# Patient Record
Sex: Male | Born: 1970 | Race: White | Hispanic: No | Marital: Married | Smoking: Never smoker
Health system: Southern US, Community
[De-identification: ages and names within clinical notes are randomized; demographics above are authoritative.]

---

## 2003-11-09 ENCOUNTER — Emergency Department (HOSPITAL_COMMUNITY): Admission: EM | Admit: 2003-11-09 | Discharge: 2003-11-09 | Payer: Self-pay | Admitting: Emergency Medicine

## 2007-11-05 ENCOUNTER — Emergency Department (HOSPITAL_COMMUNITY): Admission: EM | Admit: 2007-11-05 | Discharge: 2007-11-05 | Payer: Self-pay | Admitting: Emergency Medicine

## 2011-06-26 LAB — POCT I-STAT CREATININE
Creatinine, Ser: 1.1
Operator id: 270651

## 2011-06-26 LAB — HEPATIC FUNCTION PANEL
ALT: 40
AST: 39 — ABNORMAL HIGH
Total Protein: 6.6

## 2011-06-26 LAB — I-STAT 8, (EC8 V) (CONVERTED LAB)
Acid-Base Excess: 2
Chloride: 103
HCT: 45
Operator id: 270651
Potassium: 3.4 — ABNORMAL LOW
TCO2: 27
pCO2, Ven: 36.4 — ABNORMAL LOW

## 2011-06-26 LAB — CBC
Platelets: 187
RDW: 12.8

## 2011-06-26 LAB — DIFFERENTIAL
Basophils Absolute: 0
Lymphocytes Relative: 27
Neutro Abs: 2.9
Neutrophils Relative %: 60

## 2012-05-16 ENCOUNTER — Emergency Department (HOSPITAL_COMMUNITY): Payer: 59

## 2012-05-16 ENCOUNTER — Encounter (HOSPITAL_COMMUNITY): Payer: Self-pay | Admitting: *Deleted

## 2012-05-16 ENCOUNTER — Emergency Department (HOSPITAL_COMMUNITY)
Admission: EM | Admit: 2012-05-16 | Discharge: 2012-05-16 | Disposition: A | Discharge status: Home / Self Care | Payer: 59 | Attending: Emergency Medicine | Admitting: Emergency Medicine

## 2012-05-16 DIAGNOSIS — R0781 Pleurodynia: Secondary | ICD-10-CM

## 2012-05-16 DIAGNOSIS — T148XXA Other injury of unspecified body region, initial encounter: Secondary | ICD-10-CM | POA: Insufficient documentation

## 2012-05-16 DIAGNOSIS — S51809A Unspecified open wound of unspecified forearm, initial encounter: Secondary | ICD-10-CM | POA: Insufficient documentation

## 2012-05-16 DIAGNOSIS — T07XXXA Unspecified multiple injuries, initial encounter: Secondary | ICD-10-CM

## 2012-05-16 DIAGNOSIS — R079 Chest pain, unspecified: Secondary | ICD-10-CM | POA: Insufficient documentation

## 2012-05-16 MED ORDER — HYDROCODONE-ACETAMINOPHEN 5-325 MG PO TABS: 1.0000 | ORAL_TABLET | ORAL | Status: AC | PRN
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
## 2012-05-16 NOTE — ED Notes (Signed)
Pt c/o left rib pain, road rash noted to chest wall, describes pain as sharp with deep inspiration, area palpated, no crepitus noted

## 2013-01-28 ENCOUNTER — Ambulatory Visit (INDEPENDENT_AMBULATORY_CARE_PROVIDER_SITE_OTHER): Payer: 59 | Admitting: Family Medicine

## 2013-01-28 ENCOUNTER — Ambulatory Visit: Payer: 59

## 2013-01-28 VITALS — BP 113/70 | HR 109 | Temp 99.2°F | Resp 16 | Ht 69.5 in | Wt 172.6 lb

## 2013-01-28 DIAGNOSIS — R05 Cough: Secondary | ICD-10-CM

## 2013-01-28 DIAGNOSIS — K625 Hemorrhage of anus and rectum: Secondary | ICD-10-CM

## 2013-01-28 DIAGNOSIS — J40 Bronchitis, not specified as acute or chronic: Secondary | ICD-10-CM

## 2013-01-28 DIAGNOSIS — R059 Cough, unspecified: Secondary | ICD-10-CM

## 2013-01-28 DIAGNOSIS — K921 Melena: Secondary | ICD-10-CM

## 2013-01-28 LAB — POCT CBC
Granulocyte percent: 53.8 %G (ref 37–80)
Hemoglobin: 11.5 g/dL — AB (ref 14.1–18.1)
MCH, POC: 27.6 pg (ref 27–31.2)
MPV: 8.4 fL (ref 0–99.8)
POC MID %: 10.5 %M (ref 0–12)
RBC: 4.16 M/uL — AB (ref 4.69–6.13)
WBC: 5.4 10*3/uL (ref 4.6–10.2)

## 2013-01-28 LAB — POCT INFLUENZA A/B
Influenza A, POC: NEGATIVE
Influenza B, POC: NEGATIVE

## 2013-01-28 MED ORDER — AZITHROMYCIN 500 MG PO TABS: 500.0000 mg | ORAL_TABLET | Freq: Every day | ORAL | Status: DC

## 2013-01-28 MED ORDER — GUAIFENESIN-CODEINE 100-10 MG/5ML PO SOLN: ORAL | Status: DC

## 2013-01-28 MED ORDER — ALBUTEROL SULFATE HFA 108 (90 BASE) MCG/ACT IN AERS: 2.0000 | INHALATION_SPRAY | RESPIRATORY_TRACT | Status: DC | PRN

## 2013-01-28 NOTE — Progress Notes (Signed)
Xray read and patient discussed with Eula Listen, PA-C. Agree with assessment and plan of care per his note. XR report: Findings: Heart and mediastinal contours are within normal limits.  No focal opacities or effusions. No acute bony abnormality. Slight  peribronchial thickening.  IMPRESSION:  Bronchitic changes.

## 2013-01-28 NOTE — Progress Notes (Signed)
Patient ID: Adam Salinas MRN: [id number], DOB: [birth year], 42 y.o. Date of Encounter: 01/28/2013, 2:14 PM  Primary Physician: No primary provider on file.  Chief Complaint: URI symptoms x 5-6 days  HPI: 42 y.o. male with history below presents with 5-6 day history of worsening productive cough, nasal congestion, post nasal drip, sinus pressure, and a 1 day history of fever, chills, and myalgias. Uncertain of T max. Cough is productive of yellow sputum and worse at night time. No shortness of breath or wheezing. Not sleeping well at nighttime. Sore from coughing. Notes some chest tightness only when coughing. No chest pain. No sick contacts, recent antibiotics, or recent travels. No sedentary periods, history of cancer, trauma, or tobacco use.    Patient states about 3-4 years ago he was evaluated by a GI physician who's name he cannot recall today for BRBPR associated with diarrhea. At that time he had an anoscopy and was diagnosed with hemorrhoids. His symptoms did improve for a while. However he states that recently he has been feeling "yucky," again and has been noticing BRBPR and some dark red blood as well. No pain associated with bowel movements.   No past medical history on file.   Home Meds: Prior to Admission medications   Not on File    Allergies: No Known Allergies  History   Social History  . Marital Status: Married    Spouse Name: N/A    Number of Children: N/A  . Years of Education: N/A   Occupational History  . Not on file.   Social History Main Topics  . Smoking status: Never Smoker   . Smokeless tobacco: Not on file  . Alcohol Use: Yes     Comment: occasionally  . Drug Use: No  . Sexually Active: Yes    Birth Control/ Protection: None   Other Topics Concern  . Not on file   Social History Narrative  . No narrative on file     Review of Systems: Constitutional: positive for fevers, chills, myalgias, and fatigue. negative for weight changes, and  night sweats.  HEENT: see above Cardiovascular: negative for chest pain and palpitations  Respiratory: see above Abdominal: positive for diarrhea, BRBPR, and melena. negative for abdominal pain, nausea, and vomiting Dermatologic:negative for rash Neurologic: positive for headache. negative for dizziness      Physical Exam: Blood pressure 113/70, pulse 109, temperature 99.2 F (37.3 C), temperature source Oral, resp. rate 16, height 5' 9.5" (1.765 m), weight 172 lb 9.6 oz (78.291 kg), SpO2 96.00%., Body mass index is 25.13 kg/(m^2). General: Well developed, well nourished, in no acute distress. Not toxic appearing.  Head: Normocephalic, atraumatic, eyes without discharge, sclera non-icteric, nares are without discharge. Bilateral auditory canals clear, TM's are without perforation, pearly grey and translucent with reflective cone of light bilaterally. Oral cavity moist, posterior pharynx with post nasal drip. No exudate, erythema, or peritonsillar abscess. Uvula midline.   Neck: Supple. No thyromegaly. Full ROM. No lymphadenopathy. Lungs: Coarse breath sounds bilaterally without wheezes, rales, or rhonchi. Breathing is unlabored. Heart: RRR with S1 S2. No murmurs, rubs, or gallops appreciated. Msk:  Strength and tone normal for age. Extremities/Skin: Warm and dry. No clubbing or cyanosis. No edema. No rashes or suspicious lesions. Neuro: Alert and oriented X 3. Moves all extremities spontaneously. Gait is normal. CNII-XII grossly in tact. Psych:  Responds to questions appropriately with a normal affect.   Labs: Results for orders placed in visit on 01/28/13  POCT CBC  Result Value Range   WBC 5.4  4.6 - 10.2 K/uL   Lymph, poc 1.9  0.6 - 3.4   POC LYMPH PERCENT 35.7  10 - 50 %L   MID (cbc) 0.6  0 - 0.9   POC MID % 10.5  0 - 12 %M   POC Granulocyte 2.9  2 - 6.9   Granulocyte percent 53.8  37 - 80 %G   RBC 4.16 (*) 4.69 - 6.13 M/uL   Hemoglobin 11.5 (*) 14.1 - 18.1 g/dL   HCT, POC  16.1 (*) 09.6 - 53.7 %   MCV 90.2  80 - 97 fL   MCH, POC 27.6  27 - 31.2 pg   MCHC 30.7 (*) 31.8 - 35.4 g/dL   RDW, POC 04.5     Platelet Count, POC 234  142 - 424 K/uL   MPV 8.4  0 - 99.8 fL  POCT INFLUENZA A/B      Result Value Range   Influenza A, POC Negative     Influenza B, POC Negative      CXR: [facility] reading (PRIMARY) by  Dr. Neva Seat. Mildly increased perihilar markings. Otherwise negative.   ASSESSMENT AND PLAN:  42 y.o. male with bronchitis, anemia, and history of BRBPR/melena 1) Bronchitis  -Azithromycin 500 mg 1 po daily #5 no RF -Guaifenesin-Codeine 1 tsp po q 4-6 hours prn cough #120 mL no RF -Proventil 2 puffs inhaled q 4-6 hours prn #1 no RF -Mucinex -Rest/Fluids -RTC prn  2) Anemia, history of BRBPR/melena -Previously evaluated by unknown GI -Will refer for evaluation and treatment -Gave blood 3 weeks prior   SignedEula Listen, PA-C 01/28/2013 2:14 PM

## 2013-02-28 ENCOUNTER — Telehealth: Payer: Self-pay | Admitting: Radiology

## 2013-02-28 NOTE — Telephone Encounter (Signed)
I have gotten report of the pathology from Dr Loreta Ave. Patient has had polyp removed, was not malignant. Called him to advise, he does have follow up with Dr Loreta Ave tomorrow.

## 2013-04-15 ENCOUNTER — Telehealth: Payer: Self-pay | Admitting: Radiology

## 2013-04-15 NOTE — Telephone Encounter (Signed)
Called patient per Eula Listen, we have gotten report of pathology from Dr Loreta Ave. Patient has findings of possible ulceration. Patient should return to clinic for [facility] labs, unless Dr Loreta Ave has already done this. I left message for him to call me back

## 2013-04-21 NOTE — Telephone Encounter (Signed)
Patient has not returned call, letter mailed to him

## 2013-04-28 DIAGNOSIS — K219 Gastro-esophageal reflux disease without esophagitis: Secondary | ICD-10-CM | POA: Insufficient documentation

## 2013-06-06 ENCOUNTER — Encounter (HOSPITAL_COMMUNITY): Payer: Self-pay | Admitting: *Deleted

## 2013-06-06 ENCOUNTER — Inpatient Hospital Stay (HOSPITAL_COMMUNITY)
Admission: EM | Admit: 2013-06-06 | Discharge: 2013-06-08 | DRG: 502 | Disposition: A | Discharge status: Home / Self Care | Payer: 59 | Source: Other Acute Inpatient Hospital | Attending: Orthopaedic Surgery | Admitting: Orthopaedic Surgery

## 2013-06-06 DIAGNOSIS — K219 Gastro-esophageal reflux disease without esophagitis: Secondary | ICD-10-CM

## 2013-06-06 DIAGNOSIS — A4901 Methicillin susceptible Staphylococcus aureus infection, unspecified site: Secondary | ICD-10-CM | POA: Diagnosis present

## 2013-06-06 DIAGNOSIS — M7040 Prepatellar bursitis, unspecified knee: Principal | ICD-10-CM | POA: Diagnosis present

## 2013-06-06 MED ORDER — OXYCODONE-ACETAMINOPHEN 5-325 MG PO TABS: 1.0000 | ORAL_TABLET | Freq: Once | ORAL | Status: DC

## 2013-06-06 MED ORDER — HYDROMORPHONE HCL PF 1 MG/ML IJ SOLN
1.0000 mg | Freq: Once | INTRAMUSCULAR | Status: AC | PRN
  Administered 2013-06-06: 1 mg via INTRAVENOUS
  Filled 2013-06-06: qty 1

## 2013-06-06 MED ORDER — ONDANSETRON HCL 4 MG/2ML IJ SOLN
4.0000 mg | Freq: Once | INTRAMUSCULAR | Status: AC
  Administered 2013-06-06: 4 mg via INTRAVENOUS
  Filled 2013-06-06: qty 2

## 2013-06-06 NOTE — ED Notes (Signed)
Pt states that he feels too nauseated for the percocet.

## 2013-06-06 NOTE — ED Notes (Signed)
Pt admitting MD down in waiting room to exam and place admission orders. Per request of MD pt moved to room for exam.

## 2013-06-06 NOTE — ED Notes (Signed)
Pt states that he has been having knee draining from his PCP and the culture came back positive for Staph and his PCP wants him to be admitted for the infection of his left knee. Pt had 3 rounds of doxycycline with no relief.

## 2013-06-07 ENCOUNTER — Encounter (HOSPITAL_COMMUNITY): Payer: Self-pay | Admitting: Anesthesiology

## 2013-06-07 ENCOUNTER — Inpatient Hospital Stay (HOSPITAL_COMMUNITY): Payer: 59 | Admitting: Anesthesiology

## 2013-06-07 ENCOUNTER — Encounter (HOSPITAL_COMMUNITY)
Admission: EM | Disposition: A | Discharge status: Home / Self Care | Payer: Self-pay | Source: Other Acute Inpatient Hospital | Attending: Orthopaedic Surgery

## 2013-06-07 HISTORY — PX: IRRIGATION AND DEBRIDEMENT KNEE: SHX5185

## 2013-06-07 LAB — COMPREHENSIVE METABOLIC PANEL
AST: 49 U/L — ABNORMAL HIGH (ref 0–37)
Albumin: 3.6 g/dL (ref 3.5–5.2)
CO2: 23 mEq/L (ref 19–32)
Calcium: 9.1 mg/dL (ref 8.4–10.5)
Creatinine, Ser: 0.86 mg/dL (ref 0.50–1.35)
GFR calc non Af Amer: >90 mL/min (ref >90)

## 2013-06-07 LAB — C-REACTIVE PROTEIN
CRP: 9.1 mg/dL — ABNORMAL HIGH (ref <0.60)
CRP: 5.9 mg/dL — ABNORMAL HIGH (ref <0.60)

## 2013-06-07 LAB — SURGICAL PCR SCREEN: MRSA, PCR: NEGATIVE

## 2013-06-07 LAB — CBC WITH DIFFERENTIAL/PLATELET
Basophils Absolute: 0 10*3/uL (ref 0.0–0.1)
Eosinophils Absolute: 0 10*3/uL (ref 0.0–0.7)
Eosinophils Relative: 0 % (ref 0–5)
MCH: 32 pg (ref 26.0–34.0)
MCHC: 35.2 g/dL (ref 30.0–36.0)
MCV: 90.9 fL (ref 78.0–100.0)
Platelets: 186 10*3/uL (ref 150–400)
RDW: 14.8 % (ref 11.5–15.5)

## 2013-06-07 SURGERY — IRRIGATION AND DEBRIDEMENT KNEE
Anesthesia: General | Site: Knee | Laterality: Left | Wound class: Dirty or Infected

## 2013-06-07 MED ORDER — PIPERACILLIN-TAZOBACTAM 3.375 G IVPB
3.3750 g | Freq: Three times a day (TID) | INTRAVENOUS | Status: DC
  Administered 2013-06-07 – 2013-06-08 (×6): 3.375 g via INTRAVENOUS
  Filled 2013-06-07 (×8): qty 50

## 2013-06-07 MED ORDER — POLYETHYLENE GLYCOL 3350 17 G PO PACK
17.0000 g | PACK | Freq: Every day | ORAL | Status: DC | PRN
  Filled 2013-06-07: qty 1

## 2013-06-07 MED ORDER — SODIUM CHLORIDE 0.9 % IV SOLN
INTRAVENOUS | Status: DC
  Administered 2013-06-07: 20:00:00 via INTRAVENOUS
  Administered 2013-06-07: 1000 mL via INTRAVENOUS
  Administered 2013-06-08: 14:00:00 via INTRAVENOUS

## 2013-06-07 MED ORDER — SENNA 8.6 MG PO TABS
1.0000 | ORAL_TABLET | Freq: Two times a day (BID) | ORAL | Status: DC
  Administered 2013-06-07 – 2013-06-08 (×3): 8.6 mg via ORAL
  Filled 2013-06-07 (×4): qty 1

## 2013-06-07 MED ORDER — BUPIVACAINE HCL (PF) 0.25 % IJ SOLN
INTRAMUSCULAR | Status: AC
  Filled 2013-06-07: qty 30

## 2013-06-07 MED ORDER — ARTIFICIAL TEARS OP OINT
TOPICAL_OINTMENT | OPHTHALMIC | Status: DC | PRN
  Administered 2013-06-07: 1 via OPHTHALMIC

## 2013-06-07 MED ORDER — SORBITOL 70 % SOLN: 30.0000 mL | Freq: Every day | Status: DC | PRN

## 2013-06-07 MED ORDER — VANCOMYCIN HCL 10 G IV SOLR
1250.0000 mg | Freq: Two times a day (BID) | INTRAVENOUS | Status: DC
  Administered 2013-06-07: 1250 mg via INTRAVENOUS
  Filled 2013-06-07 (×3): qty 1250

## 2013-06-07 MED ORDER — SODIUM CHLORIDE 0.9 % IV SOLN
1500.0000 mg | Freq: Two times a day (BID) | INTRAVENOUS | Status: DC
  Administered 2013-06-07 – 2013-06-08 (×2): 1500 mg via INTRAVENOUS
  Filled 2013-06-07 (×3): qty 1500

## 2013-06-07 MED ORDER — PROMETHAZINE HCL 25 MG/ML IJ SOLN: 6.2500 mg | INTRAMUSCULAR | Status: DC | PRN

## 2013-06-07 MED ORDER — PIPERACILLIN-TAZOBACTAM 3.375 G IVPB: 3.3750 g | Freq: Three times a day (TID) | INTRAVENOUS | Status: DC

## 2013-06-07 MED ORDER — MIDAZOLAM HCL 5 MG/5ML IJ SOLN
INTRAMUSCULAR | Status: DC | PRN
  Administered 2013-06-07: 2 mg via INTRAVENOUS

## 2013-06-07 MED ORDER — VANCOMYCIN HCL 10 G IV SOLR
1500.0000 mg | Freq: Two times a day (BID) | INTRAVENOUS | Status: DC
  Filled 2013-06-07: qty 1500

## 2013-06-07 MED ORDER — ONDANSETRON HCL 4 MG/2ML IJ SOLN
INTRAMUSCULAR | Status: DC | PRN
  Administered 2013-06-07: 4 mg via INTRAVENOUS

## 2013-06-07 MED ORDER — KETOROLAC TROMETHAMINE 30 MG/ML IJ SOLN
30.0000 mg | Freq: Four times a day (QID) | INTRAMUSCULAR | Status: DC | PRN
  Administered 2013-06-07 – 2013-06-08 (×3): 30 mg via INTRAVENOUS
  Filled 2013-06-07 (×3): qty 1

## 2013-06-07 MED ORDER — VANCOMYCIN HCL IN DEXTROSE 1-5 GM/200ML-% IV SOLN: 1000.0000 mg | Freq: Two times a day (BID) | INTRAVENOUS | Status: DC

## 2013-06-07 MED ORDER — LIDOCAINE HCL (CARDIAC) 20 MG/ML IV SOLN
INTRAVENOUS | Status: DC | PRN
  Administered 2013-06-07: 100 mg via INTRAVENOUS

## 2013-06-07 MED ORDER — MAGNESIUM CITRATE PO SOLN: 1.0000 | Freq: Once | ORAL | Status: AC | PRN

## 2013-06-07 MED ORDER — MORPHINE SULFATE 2 MG/ML IJ SOLN
2.0000 mg | INTRAMUSCULAR | Status: DC | PRN
Start: 2013-06-07 — End: 2013-06-08

## 2013-06-07 MED ORDER — 0.9 % SODIUM CHLORIDE (POUR BTL) OPTIME
TOPICAL | Status: DC | PRN
  Administered 2013-06-07: 1000 mL

## 2013-06-07 MED ORDER — DIPHENHYDRAMINE HCL 12.5 MG/5ML PO ELIX: 25.0000 mg | ORAL_SOLUTION | ORAL | Status: DC | PRN

## 2013-06-07 MED ORDER — MORPHINE SULFATE 2 MG/ML IJ SOLN: 1.0000 mg | INTRAMUSCULAR | Status: DC | PRN

## 2013-06-07 MED ORDER — BUPIVACAINE HCL (PF) 0.25 % IJ SOLN
INTRAMUSCULAR | Status: DC | PRN
  Administered 2013-06-07: 30 mL

## 2013-06-07 MED ORDER — ONDANSETRON HCL 4 MG PO TABS: 4.0000 mg | ORAL_TABLET | Freq: Four times a day (QID) | ORAL | Status: DC | PRN

## 2013-06-07 MED ORDER — ONDANSETRON HCL 4 MG/2ML IJ SOLN
4.0000 mg | Freq: Four times a day (QID) | INTRAMUSCULAR | Status: DC | PRN
  Administered 2013-06-07 – 2013-06-08 (×2): 4 mg via INTRAVENOUS
  Filled 2013-06-07 (×2): qty 2

## 2013-06-07 MED ORDER — PROPOFOL 10 MG/ML IV BOLUS
INTRAVENOUS | Status: DC | PRN
  Administered 2013-06-07: 200 mg via INTRAVENOUS

## 2013-06-07 MED ORDER — OXYCODONE HCL 5 MG PO TABS
5.0000 mg | ORAL_TABLET | ORAL | Status: DC | PRN
  Administered 2013-06-07 – 2013-06-08 (×3): 10 mg via ORAL
  Filled 2013-06-07 (×3): qty 2

## 2013-06-07 MED ORDER — METHOCARBAMOL 100 MG/ML IJ SOLN: 500.0000 mg | Freq: Four times a day (QID) | INTRAVENOUS | Status: DC | PRN

## 2013-06-07 MED ORDER — LACTATED RINGERS IV SOLN
INTRAVENOUS | Status: DC | PRN
  Administered 2013-06-07 (×2): via INTRAVENOUS

## 2013-06-07 MED ORDER — SODIUM CHLORIDE 0.9 % IR SOLN
Status: DC | PRN
  Administered 2013-06-07 (×2): 3000 mL

## 2013-06-07 MED ORDER — METHOCARBAMOL 500 MG PO TABS
500.0000 mg | ORAL_TABLET | Freq: Four times a day (QID) | ORAL | Status: DC | PRN
  Administered 2013-06-07 – 2013-06-08 (×3): 500 mg via ORAL
  Filled 2013-06-07 (×3): qty 1

## 2013-06-07 MED ORDER — FENTANYL CITRATE 0.05 MG/ML IJ SOLN
INTRAMUSCULAR | Status: DC | PRN
  Administered 2013-06-07 (×3): 50 ug via INTRAVENOUS

## 2013-06-07 MED ORDER — ACETAMINOPHEN 325 MG PO TABS: 650.0000 mg | ORAL_TABLET | Freq: Four times a day (QID) | ORAL | Status: DC | PRN

## 2013-06-07 SURGICAL SUPPLY — 49 items
BANDAGE ELASTIC 6 VELCRO ST LF (GAUZE/BANDAGES/DRESSINGS) ×2 IMPLANT
BLADE SURG 10 STRL SS (BLADE) ×2 IMPLANT
BNDG COHESIVE 4X5 TAN STRL (GAUZE/BANDAGES/DRESSINGS) IMPLANT
BNDG COHESIVE 6X5 TAN STRL LF (GAUZE/BANDAGES/DRESSINGS) IMPLANT
BNDG GAUZE STRTCH 6 (GAUZE/BANDAGES/DRESSINGS) IMPLANT
CLOTH BEACON ORANGE TIMEOUT ST (SAFETY) ×2 IMPLANT
COTTON STERILE ROLL (GAUZE/BANDAGES/DRESSINGS) IMPLANT
COVER SURGICAL LIGHT HANDLE (MISCELLANEOUS) ×2 IMPLANT
CUFF TOURNIQUET SINGLE 18IN (TOURNIQUET CUFF) ×2 IMPLANT
CUFF TOURNIQUET SINGLE 24IN (TOURNIQUET CUFF) IMPLANT
CUFF TOURNIQUET SINGLE 34IN LL (TOURNIQUET CUFF) ×2 IMPLANT
CUFF TOURNIQUET SINGLE 44IN (TOURNIQUET CUFF) IMPLANT
DRAIN PENROSE 1/4X12 LTX STRL (WOUND CARE) ×2 IMPLANT
DRAPE U-SHAPE 47X51 STRL (DRAPES) ×2 IMPLANT
DRAPE U-SHAPE 76X120 STRL (DRAPES) ×2 IMPLANT
DRSG ADAPTIC 3X8 NADH LF (GAUZE/BANDAGES/DRESSINGS) IMPLANT
DURAPREP 26ML APPLICATOR (WOUND CARE) ×2 IMPLANT
ELECT CAUTERY BLADE 6.4 (BLADE) IMPLANT
ELECT REM PT RETURN 9FT ADLT (ELECTROSURGICAL) ×2
ELECTRODE REM PT RTRN 9FT ADLT (ELECTROSURGICAL) ×1 IMPLANT
GAUZE XEROFORM 5X9 LF (GAUZE/BANDAGES/DRESSINGS) ×2 IMPLANT
GLOVE ORTHO TXT STRL SZ7.5 (GLOVE) ×2 IMPLANT
GLOVE SURG SS PI 6.0 STRL IVOR (GLOVE) ×2 IMPLANT
GOWN PREVENTION PLUS XLARGE (GOWN DISPOSABLE) ×2 IMPLANT
GOWN SRG XL XLNG 56XLVL 4 (GOWN DISPOSABLE) IMPLANT
GOWN STRL NON-REIN XL XLG LVL4 (GOWN DISPOSABLE)
HANDPIECE INTERPULSE COAX TIP (DISPOSABLE)
IMMOBILIZER KNEE 22 UNIV (SOFTGOODS) ×2 IMPLANT
KIT BASIN OR (CUSTOM PROCEDURE TRAY) ×2 IMPLANT
KIT ROOM TURNOVER OR (KITS) ×2 IMPLANT
MANIFOLD NEPTUNE II (INSTRUMENTS) ×2 IMPLANT
NEEDLE HYPO 25GX1X1/2 BEV (NEEDLE) ×2 IMPLANT
NS IRRIG 1000ML POUR BTL (IV SOLUTION) ×2 IMPLANT
PACK ORTHO EXTREMITY (CUSTOM PROCEDURE TRAY) ×2 IMPLANT
PAD ARMBOARD 7.5X6 YLW CONV (MISCELLANEOUS) ×4 IMPLANT
PADDING CAST COTTON 6X4 STRL (CAST SUPPLIES) ×2 IMPLANT
SET HNDPC FAN SPRY TIP SCT (DISPOSABLE) IMPLANT
SPONGE GAUZE 4X4 12PLY (GAUZE/BANDAGES/DRESSINGS) ×2 IMPLANT
SPONGE LAP 18X18 X RAY DECT (DISPOSABLE) ×2 IMPLANT
STOCKINETTE IMPERVIOUS 9X36 MD (GAUZE/BANDAGES/DRESSINGS) ×2 IMPLANT
SUT ETHILON 2 0 FS 18 (SUTURE) ×4 IMPLANT
SUT MON AB 2-0 CT1 36 (SUTURE) ×2 IMPLANT
TOWEL OR 17X24 6PK STRL BLUE (TOWEL DISPOSABLE) ×2 IMPLANT
TOWEL OR 17X26 10 PK STRL BLUE (TOWEL DISPOSABLE) ×2 IMPLANT
TUBE ANAEROBIC SPECIMEN COL (MISCELLANEOUS) IMPLANT
TUBE CONNECTING 12X1/4 (SUCTIONS) ×2 IMPLANT
UNDERPAD 30X30 INCONTINENT (UNDERPADS AND DIAPERS) ×2 IMPLANT
WATER STERILE IRR 1000ML POUR (IV SOLUTION) IMPLANT
YANKAUER SUCT BULB TIP NO VENT (SUCTIONS) ×2 IMPLANT

## 2013-06-07 NOTE — Progress Notes (Signed)
Dressing removed - no drainage on dressing. Approximately 1 inch of Iodoform removed from Left knee - no more seen, no breakage. Small amount purulence in opening.

## 2013-06-07 NOTE — Anesthesia Postprocedure Evaluation (Signed)
Anesthesia Post Note  Patient: Adam Salinas  Procedure(s) Performed: Procedure(s) (LRB): IRRIGATION AND DEBRIDEMENT KNEE (Left)  Anesthesia type: general  Patient location: PACU  Post pain: Pain level controlled  Post assessment: Patient's Cardiovascular Status Stable  Last Vitals:  Filed Vitals:   06/07/13 1911  BP: 107/74  Pulse: 58  Temp: 36.5 C  Resp: 16    Post vital signs: Reviewed and stable  Level of consciousness: sedated  Complications: No apparent anesthesia complications

## 2013-06-07 NOTE — Brief Op Note (Signed)
Brief Op Note  Date of Surgery: 06/07/2013  Preoperative Diagnosis: Left septic prepatellar bursitis  Postoperative Diagnosis: same  Procedure: Procedure(s): IRRIGATION AND DEBRIDEMENT LEFT PREPATELLAR BURSA  Implants: none  Surgeons: Surgeon(s): Legrande Hao Glee Arvin, MD  Anesthesia: General  Drains: Penrose x 1  Estimated Blood Loss: minimal  Complications: None  Condition to PACU: Stable  Madhavi Hamblen Glee Arvin, MD [facility] 06/07/2013 6:51 PM

## 2013-06-07 NOTE — Progress Notes (Signed)
DR Roda Shutters called per patient request - wants crackers with Oxycodone prior to dressing removal - verbal ok. Patient does not want Dilaudid -"Oxycodone does better".

## 2013-06-07 NOTE — Transfer of Care (Signed)
Immediate Anesthesia Transfer of Care Note  Patient: Adam Salinas  Procedure(s) Performed: Procedure(s): IRRIGATION AND DEBRIDEMENT KNEE (Left)  Patient Location: PACU  Anesthesia Type:General  Level of Consciousness: awake, alert , oriented and sedated  Airway & Oxygen Therapy: Patient Spontanous Breathing and Patient connected to nasal cannula oxygen  Post-op Assessment: Report given to PACU RN, Post -op Vital signs reviewed and stable and Patient moving all extremities  Post vital signs: Reviewed and stable  Complications: No apparent anesthesia complications

## 2013-06-07 NOTE — Anesthesia Preprocedure Evaluation (Signed)
Anesthesia Evaluation  Patient identified by MRN, date of birth, ID band Patient awake    Reviewed: Allergy & Precautions, H&P , NPO status , Patient's Chart, lab work & pertinent test results  History of Anesthesia Complications Negative for: history of anesthetic complications  Airway Mallampati: I  Neck ROM: full    Dental no notable dental hx. (+) Teeth Intact   Pulmonary neg pulmonary ROS,  breath sounds clear to auscultation  Pulmonary exam normal       Cardiovascular negative cardio ROS  IRhythm:regular Rate:Normal     Neuro/Psych negative neurological ROS  negative psych ROS   GI/Hepatic negative GI ROS, Neg liver ROS, GERD-  ,  Endo/Other  negative endocrine ROS  Renal/GU negative Renal ROS  negative genitourinary   Musculoskeletal   Abdominal   Peds  Hematology negative hematology ROS (+)   Anesthesia Other Findings   Reproductive/Obstetrics negative OB ROS                           Anesthesia Physical Anesthesia Plan  ASA: I  Anesthesia Plan: General and General LMA   Post-op Pain Management:    Induction:   Airway Management Planned: LMA  Additional Equipment:   Intra-op Plan:   Post-operative Plan: Extubation in OR  Informed Consent: I have reviewed the patients History and Physical, chart, labs and discussed the procedure including the risks, benefits and alternatives for the proposed anesthesia with the patient or authorized representative who has indicated his/her understanding and acceptance.     Plan Discussed with: CRNA and Surgeon  Anesthesia Plan Comments:         Anesthesia Quick Evaluation

## 2013-06-07 NOTE — Preoperative (Signed)
Beta Blockers   Reason not to administer Beta Blockers:Not Applicable 

## 2013-06-07 NOTE — H&P (Signed)
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  Over night, he developed redness, swelling, pain in the anterior part of the knee with painful ROM.  He had painful weight bearing.  He was seen in the office yesterday and treated in the office with I&D and placed on bactrim.  Gram stain revealed GPC in clusters.  Cultures are pending.  Since being on bactrim, he has failed to respond favorably.  He developed worsening pain, redness, and painful knee ROM.  Also developed n/v.  He is seen tonight for this worsening.  He has intense pain with knee ROM.  The open wound continues to drain.  He has had a h/o skin boils.  No other complaints today.  Past Medical History:  has no past medical history on file.  Medications: Bactrim DS tab  Percocet  Allergies: Allergies as of 06/06/2013 - Review Complete 06/06/2013  Allergen Reaction Noted  . Hydrocodone Nausea And Vomiting 06/06/2013    Past Surgical History:  has no past surgical history on file.  Social History: History  Substance Use Topics  . Smoking status: Never Smoker   . Smokeless tobacco: Not on file  . Alcohol Use: Yes     Comment: occasionally    Work/Sport/Hobbies: firefighter  ROS: Review of Systems: All systems reviewed and are negative except that mentioned in HPI  Physical Examination: Filed Vitals:   06/06/13 2311  BP: 123/81  Pulse: 99  Temp: 98.2 F (36.8 C)  Resp: 14   Constitutional: Awake, alert.  WN/WD Appearance: healthy, no acute distress, well-groomed Affect: Normal HEENT: EOMI, mucous membranes moist CV: RRR Pulm: breathing comfortably Abd: soft, non-distended  LLE: Swelling of the prepatellar area with surrounding cellulitis that is worse than yesterday +purulent drainage from open wound Patient unable to perform  ROM due to extreme pain   Pertinent Labs: pending  Imaging: I have personally reviewed the following studies: Mild medial osteoarthritis. No acute bony abnormality.  Studies: n/a  Assessment: Infected left prepatellar bursa recalcitrant to office I&D and bactrim  Plan: 1. NPO after 1 am 2. Pain control 3. Admit to inpatient 4. vanc and zosyn 5. Blood cultures, CBC diff, CRP, ESR, CMP 6. Aspiration of the knee joint revealed a dry tap 7. Will monitor for worsening.  If no improvement, consider formal I&D in the OR  N. Glee Arvin, MD [facility] 06/07/2013 12:38 AM

## 2013-06-07 NOTE — Op Note (Signed)
Date of Surgery: 06/07/2013  INDICATIONS: Adam Salinas is a 42 y.o.-year-old male who developed an infection of the left prepatellar bursa over the weekend.  He was initially treated in the office with a bedside procedure to decompress the infection and placed on bactrim.  However, he did not respond favorably to these measures and continued to worsen and showed signs of sepsis.  The decision was was made to proceed with formal incision and drainage in the operating room.  Patient was made aware of the risks, benefits, and alternatives to surgery and agreed to proceed.  PREOPERATIVE DIAGNOSIS: Left septic prepatellar bursa  POSTOPERATIVE DIAGNOSIS: Same.  PROCEDURE:  1. Incision and drainage of major bursa, knee. CPT 27301  SURGEON: N. Glee Arvin, M.D.  ANESTHESIA: general  IV FLUIDS AND URINE: See anesthesia.  ESTIMATED BLOOD LOSS: minimal.  IMPLANTS: none  DRAINS: Penrose x 1.  COMPLICATIONS: None.  DESCRIPTION OF PROCEDURE: The patient was brought to the operating room and placed supine on the operating table.  The patient had been signed prior to the procedure. The patient had the anesthesia placed by the anesthesiologist.  The prep verification and incision time-outs were performed to confirm that this was the correct patient, site, side and location.   A well padded tourniquet was placed on the upper thigh.  The upper extremity was prepped and draped in the standard fashion.  The extremity was not exsanguinated.  The tourniquet was inflated to 300 mm Hg.   The original incision on the anterior aspect of the knee was extended.  Blunt dissection was carried down into the bursa.  There was immediate expression of pus.  Three cultures were taken.  All infected and necrotic tissue was sharply debrided using rongeurs.  The loculations were all broken up.  The space was palpated for any penetration deeper than the bursa but did not extend deeper.   An intraarticular aspiration was performed to  rule out a septic joint.  The aspiration yielded no return.  Six liters of normal saline was irrigated through the wound.  The wound was closed in layers with monofilament sutures.  A 1/4 inch penrose was placed. Hemostasis was obtained.  The wounds were cleaned and dried a final time and a sterile dressing was placed.  A knee immobilizer was applied.  The patient was then transferred back to the bed and left the operating room in stable condition.  All sponge and instrument counts were correct.  POSTOPERATIVE PLAN: Adam Salinas will be weight bearing as tolerated in the knee immobilizer.  The drain will be removed in the morning.  We will follow the intraoperative cultures as well as the cultures obtained from the office.  We will tailor the antibiotics accordingly.

## 2013-06-08 MED ORDER — DIPHTH-ACELL PERTUSSIS-TETANUS 25-58-10 LF-MCG/0.5 IM SUSP: 0.5000 mL | Freq: Once | INTRAMUSCULAR | Status: DC

## 2013-06-08 MED ORDER — OXYCODONE HCL 5 MG PO TABS: 5.0000 mg | ORAL_TABLET | ORAL | Status: AC | PRN

## 2013-06-08 MED ORDER — TETANUS-DIPHTH-ACELL PERTUSSIS 5-2.5-18.5 LF-MCG/0.5 IM SUSP
0.5000 mL | Freq: Once | INTRAMUSCULAR | Status: DC
  Administered 2013-06-08: 0.5 mL via INTRAMUSCULAR
  Filled 2013-06-08: qty 0.5

## 2013-06-08 MED ORDER — CLINDAMYCIN HCL 150 MG PO CAPS: 450.0000 mg | ORAL_CAPSULE | Freq: Three times a day (TID) | ORAL | Status: AC

## 2013-06-08 MED ORDER — CHLORHEXIDINE GLUCONATE CLOTH 2 % EX PADS: 6.0000 | MEDICATED_PAD | Freq: Every day | CUTANEOUS | Status: DC

## 2013-06-08 MED ORDER — PROMETHAZINE HCL 25 MG PO TABS: 25.0000 mg | ORAL_TABLET | Freq: Four times a day (QID) | ORAL | Status: AC | PRN

## 2013-06-08 MED ORDER — MUPIROCIN 2 % EX OINT
1.0000 "application " | TOPICAL_OINTMENT | Freq: Two times a day (BID) | CUTANEOUS | Status: DC
  Administered 2013-06-08: 1 via NASAL
  Filled 2013-06-08: qty 22

## 2013-06-08 NOTE — Progress Notes (Signed)
Subjective:  Patient reports pain as moderate.  Pain is much improved compared to yesterday.  Objective:   VITALS:   Filed Vitals:   06/07/13 1911 06/07/13 2241 06/08/13 0110 06/08/13 0608  BP: 107/74 104/58 107/56 100/54  Pulse: 58 84 70 74  Temp: 97.7 F (36.5 C) 99.1 F (37.3 C) 99.1 F (37.3 C) 99.8 F (37.7 C)  TempSrc: Oral Oral Oral Oral  Resp: 16 18 18 17   Height:      Weight:      SpO2: 96% 97% 98% 97%    Neurologically intact Neurovascular intact Sensation intact distally Intact pulses distally Dorsiflexion/Plantar flexion intact Incision: dressing C/D/I and scant drainage Compartment soft   Lab Results  Component Value Date   WBC 13.2* 06/07/2013   HGB 14.8 06/07/2013   HCT 42.0 06/07/2013   MCV 90.9 06/07/2013   PLT 186 06/07/2013     Assessment/Plan: 1 Day Post-Op   Active Problems:   * No active hospital problems. *   IV abx while cultures are pending Up with PT OOB Pain control Discharge planning   Cheral Almas 06/08/2013, 8:33 AM

## 2013-06-08 NOTE — Discharge Summary (Signed)
Physician Discharge Summary  Patient ID: Adam Salinas MRN: [id number] DOB/AGE: 44-17-1972 44 y.o.  Admit date: 06/06/2013 Discharge date: 06/08/2013  Admission Diagnoses:  Infected left prepatellar bursitis  Discharge Diagnoses:  same  Surgeries: Procedure(s): IRRIGATION AND DEBRIDEMENT KNEE on 06/06/2013 - 06/07/2013   Consultants (if any):   Discharged Condition: Improved  Hospital Course: WILLETT LEFEBER is an 42 y.o. male who was admitted 06/06/2013 with a diagnosis of infected left prepatellar bursitis and went to the operating room on 06/07/2013 and underwent the above named procedures.    He was given perioperative antibiotics:  Anti-infectives   Start     Dose/Rate Route Frequency Ordered Stop   06/08/13 0000  clindamycin (CLEOCIN) 150 MG capsule     450 mg Oral 3 times daily 06/08/13 1852 06/22/13 2359   06/07/13 2200  vancomycin (VANCOCIN) 1,500 mg in sodium chloride 0.9 % 500 mL IVPB  Status:  Discontinued     1,500 mg 250 mL/hr over 120 Minutes Intravenous Every 12 hours 06/07/13 1043 06/07/13 1305   06/07/13 2000  vancomycin (VANCOCIN) 1,500 mg in sodium chloride 0.9 % 500 mL IVPB     1,500 mg 250 mL/hr over 120 Minutes Intravenous Every 12 hours 06/07/13 1305     06/07/13 0100  vancomycin (VANCOCIN) IVPB 1000 mg/200 mL premix  Status:  Discontinued     1,000 mg 200 mL/hr over 60 Minutes Intravenous Every 12 hours 06/07/13 0046 06/07/13 0128   06/07/13 0030  vancomycin (VANCOCIN) 1,250 mg in sodium chloride 0.9 % 250 mL IVPB  Status:  Discontinued     1,250 mg 166.7 mL/hr over 90 Minutes Intravenous Every 12 hours 06/07/13 0015 06/07/13 1043   06/07/13 0030  piperacillin-tazobactam (ZOSYN) IVPB 3.375 g  Status:  Discontinued     3.375 g 12.5 mL/hr over 240 Minutes Intravenous 3 times per day 06/07/13 0015 06/07/13 0028   06/07/13 0030  piperacillin-tazobactam (ZOSYN) IVPB 3.375 g     3.375 g 12.5 mL/hr over 240 Minutes Intravenous 3 times per day 06/07/13 0028      .  He was given sequential compression devices, early ambulation for DVT prophylaxis.  He benefited maximally from the hospital stay and there were no complications.   Culture results from Sunday's aspiration showed methicillin sensitive staph aureus that is sensitive to clindamycin.     Recent vital signs:  Filed Vitals:   06/08/13 1300  BP: 108/66  Pulse: 72  Temp: 98.1 F (36.7 C)  Resp: 18    Recent laboratory studies:  Lab Results  Component Value Date   HGB 14.8 06/07/2013   HGB 11.5* 01/28/2013   HGB 15.3 11/05/2007   Lab Results  Component Value Date   WBC 13.2* 06/07/2013   PLT 186 06/07/2013   No results found for this basename: INR   Lab Results  Component Value Date   NA 135 06/07/2013   K 3.7 06/07/2013   CL 100 06/07/2013   CO2 23 06/07/2013   BUN 9 06/07/2013   CREATININE 0.86 06/07/2013   GLUCOSE 137* 06/07/2013    Discharge Medications:     Medication List         clindamycin 150 MG capsule  Commonly known as:  CLEOCIN  Take 3 capsules (450 mg total) by mouth 3 (three) times daily.     doxycycline 100 MG tablet  Commonly known as:  VIBRA-TABS  Take 100 mg by mouth 2 (two) times daily.     ferrous sulfate 325 (  65 FE) MG tablet  Take 325 mg by mouth daily with breakfast.     oxyCODONE 5 MG immediate release tablet  Commonly known as:  Oxy IR/ROXICODONE  Take 1 tablet (5 mg total) by mouth every 4 (four) hours as needed for pain.     oxyCODONE-acetaminophen 5-325 MG per tablet  Commonly known as:  PERCOCET/ROXICET  Take 1 tablet by mouth every 4 (four) hours as needed for pain.     promethazine 25 MG tablet  Commonly known as:  PHENERGAN  Take 1 tablet (25 mg total) by mouth every 6 (six) hours as needed for nausea.        Diagnostic Studies: No results found.  Disposition: 01-Home or Self Care      Discharge Orders   Future Orders Complete By Expires   Call MD / Call 911  As directed    Comments:     If you experience chest pain or  shortness of breath, CALL 911 and be transported to the hospital emergency room.  If you develope a fever above 101 F, pus (white drainage) or increased drainage or redness at the wound, or calf pain, call your surgeon's office.   Constipation Prevention  As directed    Comments:     Drink plenty of fluids.  Prune juice may be helpful.  You may use a stool softener, such as Colace (over the counter) 100 mg twice a day.  Use MiraLax (over the counter) for constipation as needed.   Driving restrictions  As directed    Comments:     No driving for while on narcotic pain meds   Increase activity slowly as tolerated  As directed       Follow-up Information   Follow up with Cheral Almas, MD In 2 weeks.   Specialty:  Orthopedic Surgery   Contact information:   [address] [place] [address] [phone]        Signed: Cheral Almas 06/08/2013, 9:00 PM

## 2013-06-08 NOTE — Progress Notes (Signed)
Orthopedic Tech Progress Note Patient Details:  Adam Salinas [birth year] [id number]      [name] 06/08/2013, 4:21 PM

## 2013-06-08 NOTE — Progress Notes (Signed)
Orthopedic Tech Progress Note Patient Details:  Adam Salinas 1971-01-19 [id number]  Patient ID: Adam Salinas, male   DOB: 1971-08-01, 42 y.o.   MRN: [id number]   Adam Salinas 06/08/2013, 4:21 PMTrapeze bar.

## 2013-06-09 ENCOUNTER — Encounter (HOSPITAL_COMMUNITY): Payer: Self-pay | Admitting: Orthopaedic Surgery

## 2013-06-10 LAB — TISSUE CULTURE

## 2013-06-10 LAB — WOUND CULTURE

## 2013-06-13 LAB — CULTURE, BLOOD (ROUTINE X 2): Culture: NO GROWTH

## 2013-06-13 LAB — ANAEROBIC CULTURE

## 2013-07-04 LAB — FUNGUS CULTURE W SMEAR: Fungal Smear: NONE SEEN

## 2013-07-05 LAB — FUNGUS CULTURE W SMEAR: Fungal Smear: NONE SEEN

## 2013-07-20 LAB — AFB CULTURE WITH SMEAR (NOT AT ARMC): Acid Fast Smear: NONE SEEN
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
## 2014-07-21 IMAGING — CR DG CHEST 2V
2 series · 2 of 2 positions shown · non-contrast
Comparison: 05/16/2012

CLINICAL DATA: Cough.

CHEST - 2 VIEW

[PA]
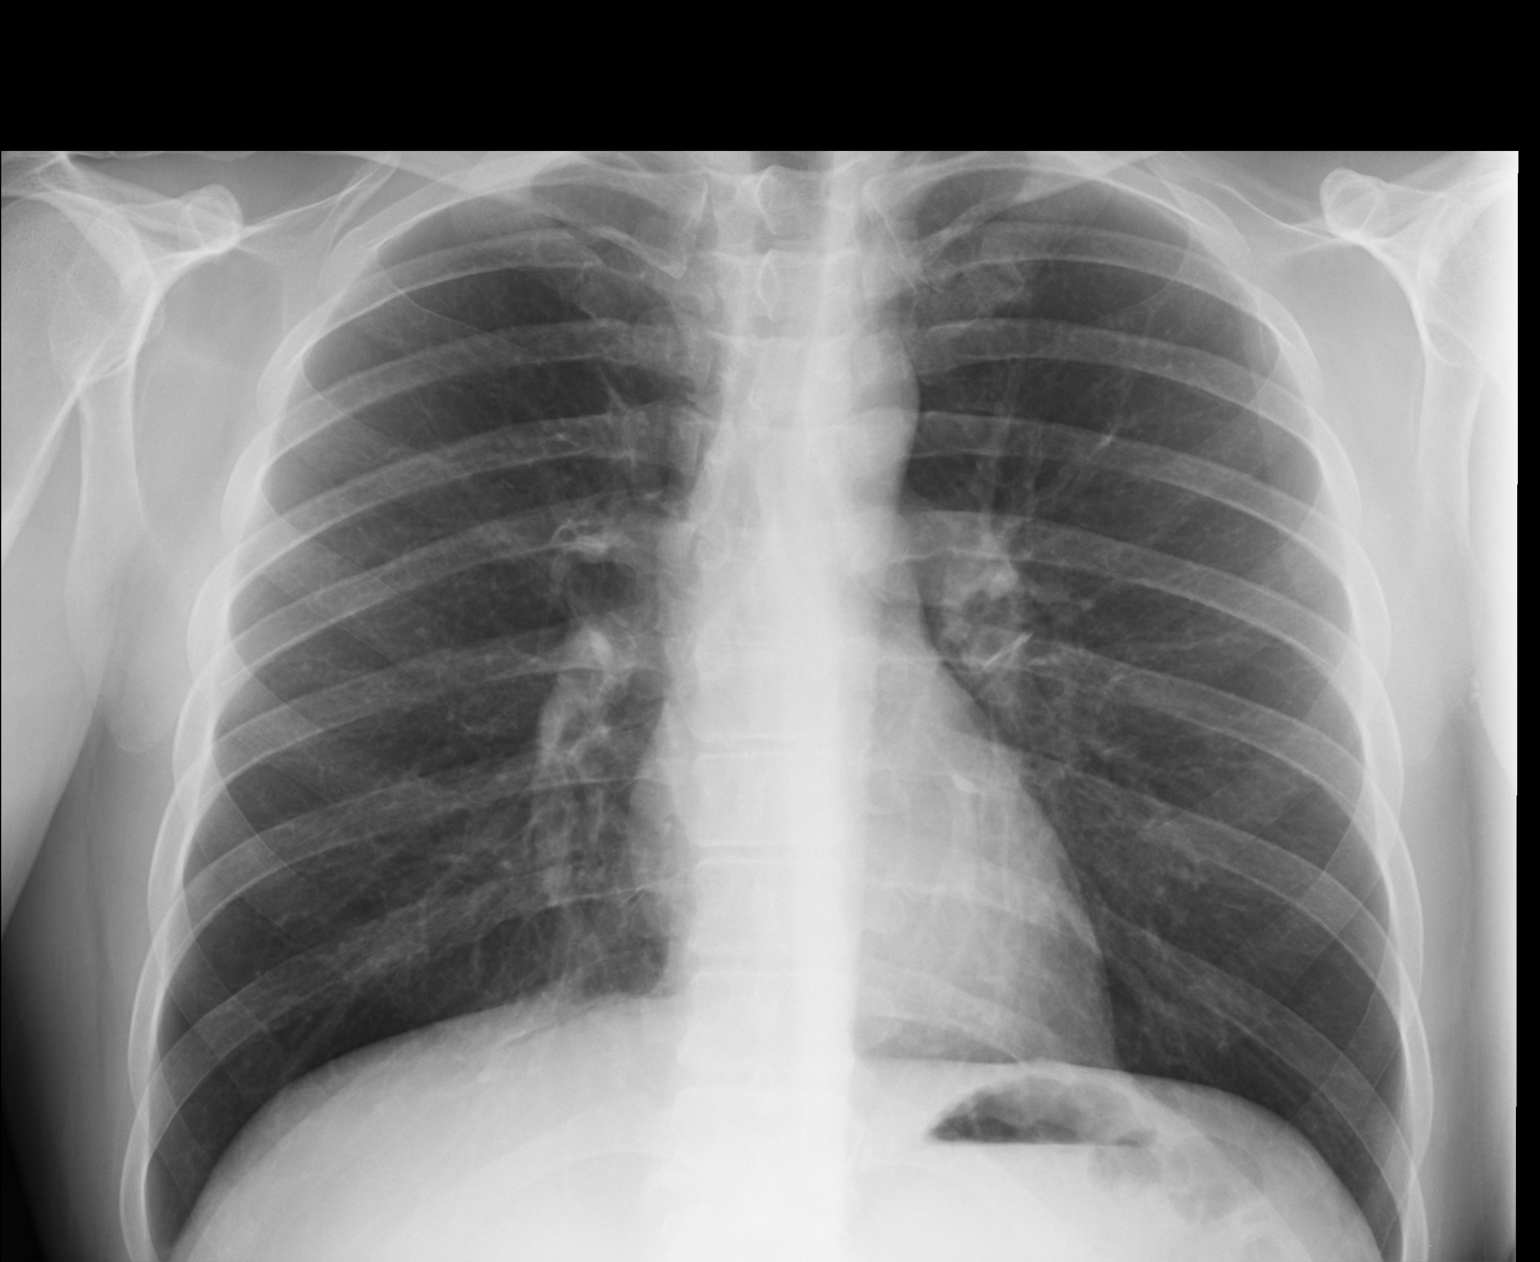

[lateral]
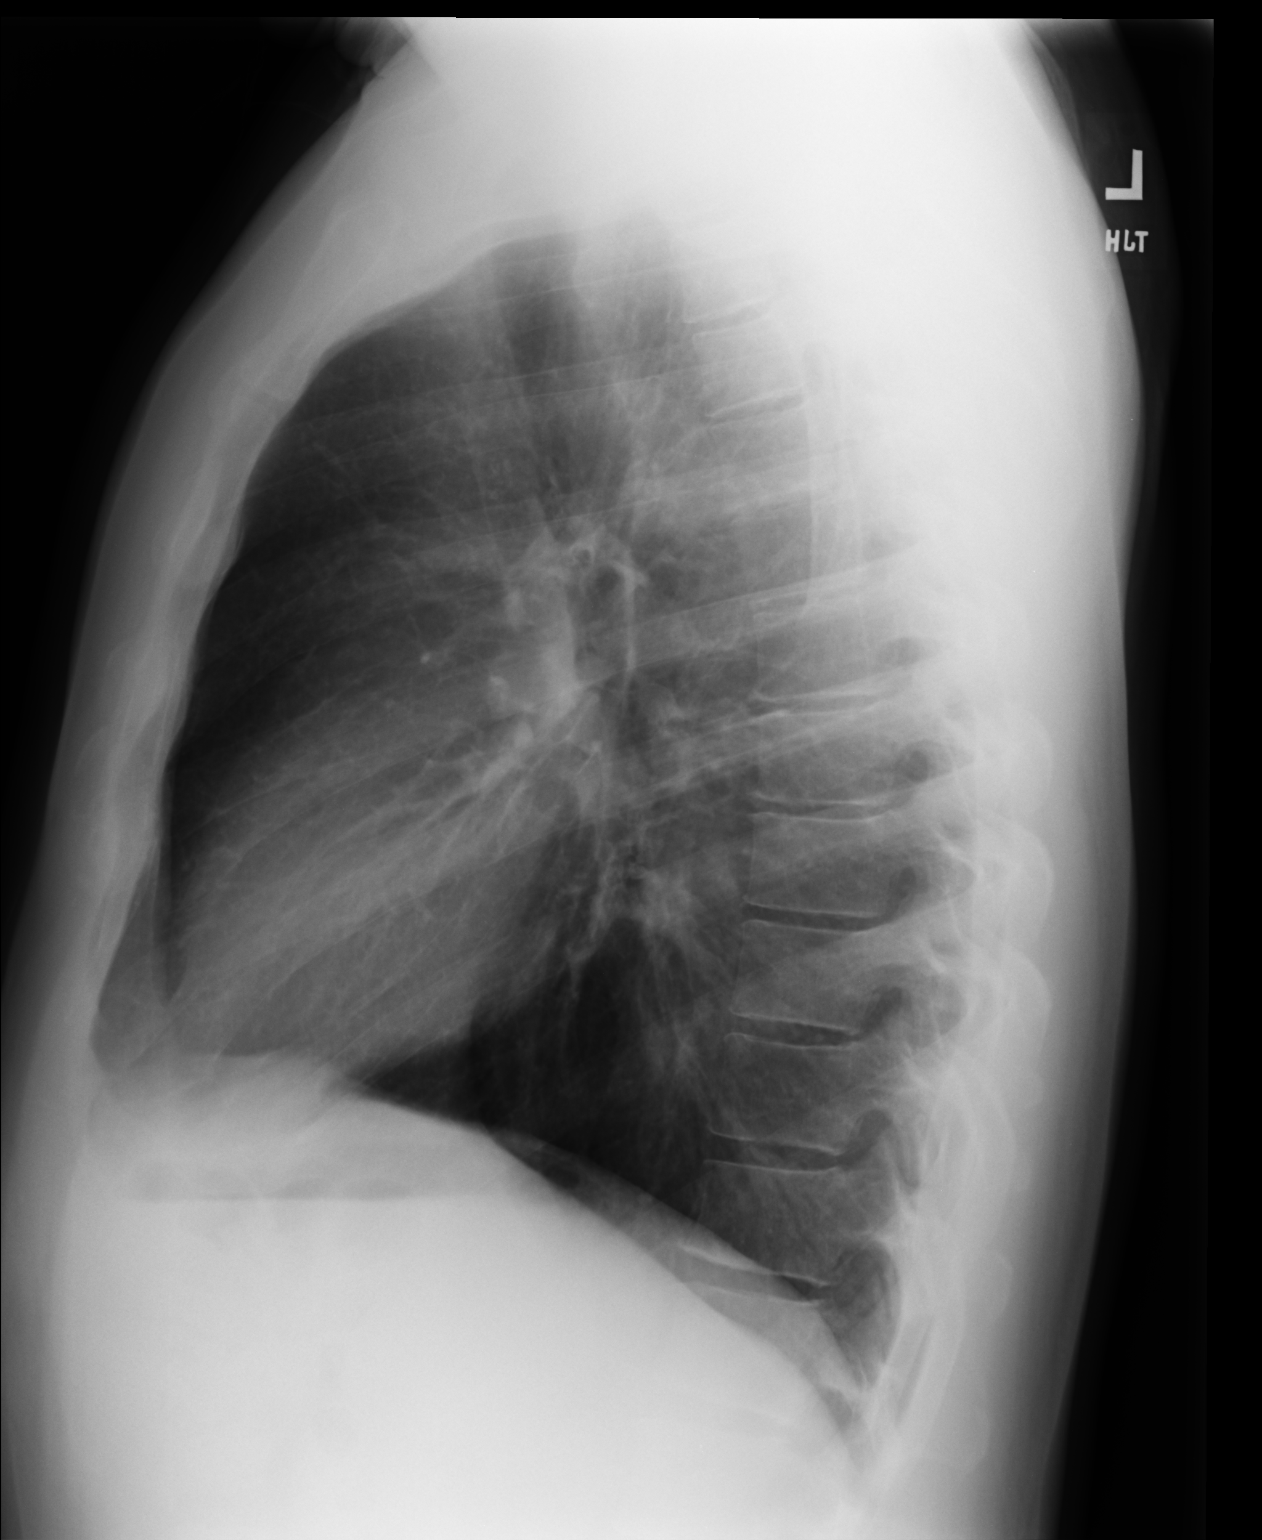

[2 of 2 positions shown; findings below may reference images not displayed]

FINDINGS: Heart and mediastinal contours are within normal limits.
No focal opacities or effusions.  No acute bony abnormality. Slight
peribronchial thickening.
IMPRESSION: Bronchitic changes.

## 2016-12-19 DIAGNOSIS — B078 Other viral warts: Secondary | ICD-10-CM | POA: Diagnosis not present

## 2016-12-19 DIAGNOSIS — L308 Other specified dermatitis: Secondary | ICD-10-CM | POA: Diagnosis not present

## 2016-12-19 DIAGNOSIS — D485 Neoplasm of uncertain behavior of skin: Secondary | ICD-10-CM | POA: Diagnosis not present

## 2017-11-18 DIAGNOSIS — R0683 Snoring: Secondary | ICD-10-CM | POA: Diagnosis not present

## 2017-11-18 DIAGNOSIS — R0681 Apnea, not elsewhere classified: Secondary | ICD-10-CM | POA: Diagnosis not present

## 2017-11-18 DIAGNOSIS — E611 Iron deficiency: Secondary | ICD-10-CM | POA: Diagnosis not present

## 2017-12-21 DIAGNOSIS — D509 Iron deficiency anemia, unspecified: Secondary | ICD-10-CM | POA: Diagnosis not present

## 2017-12-21 DIAGNOSIS — G4733 Obstructive sleep apnea (adult) (pediatric): Secondary | ICD-10-CM | POA: Diagnosis not present

## 2017-12-23 DIAGNOSIS — G4733 Obstructive sleep apnea (adult) (pediatric): Secondary | ICD-10-CM | POA: Diagnosis not present

## 2017-12-24 DIAGNOSIS — D509 Iron deficiency anemia, unspecified: Secondary | ICD-10-CM | POA: Diagnosis not present

## 2017-12-24 DIAGNOSIS — K219 Gastro-esophageal reflux disease without esophagitis: Secondary | ICD-10-CM | POA: Diagnosis not present

## 2017-12-24 DIAGNOSIS — K625 Hemorrhage of anus and rectum: Secondary | ICD-10-CM | POA: Diagnosis not present

## 2017-12-30 DIAGNOSIS — K210 Gastro-esophageal reflux disease with esophagitis: Secondary | ICD-10-CM | POA: Diagnosis not present

## 2017-12-30 DIAGNOSIS — D509 Iron deficiency anemia, unspecified: Secondary | ICD-10-CM | POA: Diagnosis not present

## 2017-12-30 DIAGNOSIS — Z1211 Encounter for screening for malignant neoplasm of colon: Secondary | ICD-10-CM | POA: Diagnosis not present

## 2017-12-30 DIAGNOSIS — K3189 Other diseases of stomach and duodenum: Secondary | ICD-10-CM | POA: Diagnosis not present

## 2017-12-30 DIAGNOSIS — D122 Benign neoplasm of ascending colon: Secondary | ICD-10-CM | POA: Diagnosis not present

## 2017-12-30 DIAGNOSIS — K635 Polyp of colon: Secondary | ICD-10-CM | POA: Diagnosis not present

## 2017-12-30 DIAGNOSIS — D125 Benign neoplasm of sigmoid colon: Secondary | ICD-10-CM | POA: Diagnosis not present

## 2018-01-05 DIAGNOSIS — G4733 Obstructive sleep apnea (adult) (pediatric): Secondary | ICD-10-CM | POA: Diagnosis not present

## 2018-01-14 DIAGNOSIS — K641 Second degree hemorrhoids: Secondary | ICD-10-CM | POA: Diagnosis not present

## 2018-01-14 DIAGNOSIS — K210 Gastro-esophageal reflux disease with esophagitis: Secondary | ICD-10-CM | POA: Diagnosis not present

## 2018-01-14 DIAGNOSIS — K625 Hemorrhage of anus and rectum: Secondary | ICD-10-CM | POA: Diagnosis not present

## 2018-01-28 DIAGNOSIS — K641 Second degree hemorrhoids: Secondary | ICD-10-CM | POA: Diagnosis not present

## 2018-02-04 DIAGNOSIS — G4733 Obstructive sleep apnea (adult) (pediatric): Secondary | ICD-10-CM | POA: Diagnosis not present

## 2018-02-09 DIAGNOSIS — G4733 Obstructive sleep apnea (adult) (pediatric): Secondary | ICD-10-CM | POA: Diagnosis not present

## 2018-02-28 DIAGNOSIS — M5136 Other intervertebral disc degeneration, lumbar region: Secondary | ICD-10-CM | POA: Diagnosis not present

## 2018-02-28 DIAGNOSIS — Z743 Need for continuous supervision: Secondary | ICD-10-CM | POA: Diagnosis not present

## 2018-02-28 DIAGNOSIS — R079 Chest pain, unspecified: Secondary | ICD-10-CM | POA: Diagnosis not present

## 2018-02-28 DIAGNOSIS — S93401A Sprain of unspecified ligament of right ankle, initial encounter: Secondary | ICD-10-CM | POA: Diagnosis not present

## 2018-02-28 DIAGNOSIS — M7989 Other specified soft tissue disorders: Secondary | ICD-10-CM | POA: Diagnosis not present

## 2018-02-28 DIAGNOSIS — M25571 Pain in right ankle and joints of right foot: Secondary | ICD-10-CM | POA: Diagnosis not present

## 2018-02-28 DIAGNOSIS — S60222A Contusion of left hand, initial encounter: Secondary | ICD-10-CM | POA: Diagnosis not present

## 2018-02-28 DIAGNOSIS — S60512A Abrasion of left hand, initial encounter: Secondary | ICD-10-CM | POA: Diagnosis not present

## 2018-02-28 DIAGNOSIS — R109 Unspecified abdominal pain: Secondary | ICD-10-CM | POA: Diagnosis not present

## 2018-02-28 DIAGNOSIS — S20211A Contusion of right front wall of thorax, initial encounter: Secondary | ICD-10-CM | POA: Diagnosis not present

## 2018-02-28 DIAGNOSIS — S3991XA Unspecified injury of abdomen, initial encounter: Secondary | ICD-10-CM | POA: Diagnosis not present

## 2018-03-02 ENCOUNTER — Ambulatory Visit (INDEPENDENT_AMBULATORY_CARE_PROVIDER_SITE_OTHER): Payer: 59 | Admitting: Orthopaedic Surgery

## 2018-03-02 ENCOUNTER — Encounter (INDEPENDENT_AMBULATORY_CARE_PROVIDER_SITE_OTHER): Payer: Self-pay | Admitting: Orthopaedic Surgery

## 2018-03-02 ENCOUNTER — Ambulatory Visit (INDEPENDENT_AMBULATORY_CARE_PROVIDER_SITE_OTHER): Payer: 59

## 2018-03-02 VITALS — Ht 70.0 in | Wt 175.0 lb

## 2018-03-02 DIAGNOSIS — M25511 Pain in right shoulder: Secondary | ICD-10-CM | POA: Diagnosis not present

## 2018-03-02 MED ORDER — IBUPROFEN 800 MG PO TABS: 800.0000 mg | ORAL_TABLET | Freq: Three times a day (TID) | ORAL | 2 refills | Status: AC | PRN
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
## 2018-03-07 DIAGNOSIS — G4733 Obstructive sleep apnea (adult) (pediatric): Secondary | ICD-10-CM | POA: Diagnosis not present

## 2018-03-24 DIAGNOSIS — D509 Iron deficiency anemia, unspecified: Secondary | ICD-10-CM | POA: Diagnosis not present

## 2018-03-24 DIAGNOSIS — G4733 Obstructive sleep apnea (adult) (pediatric): Secondary | ICD-10-CM | POA: Diagnosis not present

## 2018-03-24 DIAGNOSIS — G2581 Restless legs syndrome: Secondary | ICD-10-CM | POA: Diagnosis not present

## 2018-04-06 DIAGNOSIS — G4733 Obstructive sleep apnea (adult) (pediatric): Secondary | ICD-10-CM | POA: Diagnosis not present

## 2018-05-07 DIAGNOSIS — G4733 Obstructive sleep apnea (adult) (pediatric): Secondary | ICD-10-CM | POA: Diagnosis not present

## 2018-05-19 DIAGNOSIS — G4733 Obstructive sleep apnea (adult) (pediatric): Secondary | ICD-10-CM | POA: Diagnosis not present

## 2018-06-07 DIAGNOSIS — G4733 Obstructive sleep apnea (adult) (pediatric): Secondary | ICD-10-CM | POA: Diagnosis not present

## 2018-07-07 DIAGNOSIS — G4733 Obstructive sleep apnea (adult) (pediatric): Secondary | ICD-10-CM | POA: Diagnosis not present

## 2018-08-07 DIAGNOSIS — G4733 Obstructive sleep apnea (adult) (pediatric): Secondary | ICD-10-CM | POA: Diagnosis not present

## 2018-08-27 DIAGNOSIS — G4733 Obstructive sleep apnea (adult) (pediatric): Secondary | ICD-10-CM | POA: Diagnosis not present

## 2018-09-06 DIAGNOSIS — G4733 Obstructive sleep apnea (adult) (pediatric): Secondary | ICD-10-CM | POA: Diagnosis not present

## 2018-09-21 DIAGNOSIS — L570 Actinic keratosis: Secondary | ICD-10-CM | POA: Diagnosis not present

## 2018-09-21 DIAGNOSIS — L820 Inflamed seborrheic keratosis: Secondary | ICD-10-CM | POA: Diagnosis not present

## 2018-09-21 DIAGNOSIS — L821 Other seborrheic keratosis: Secondary | ICD-10-CM | POA: Diagnosis not present

## 2018-09-21 DIAGNOSIS — C44319 Basal cell carcinoma of skin of other parts of face: Secondary | ICD-10-CM | POA: Diagnosis not present

## 2018-09-21 DIAGNOSIS — L812 Freckles: Secondary | ICD-10-CM | POA: Diagnosis not present

## 2018-09-21 DIAGNOSIS — C44519 Basal cell carcinoma of skin of other part of trunk: Secondary | ICD-10-CM | POA: Diagnosis not present

## 2018-09-21 DIAGNOSIS — L2089 Other atopic dermatitis: Secondary | ICD-10-CM | POA: Diagnosis not present

## 2018-10-07 DIAGNOSIS — G4733 Obstructive sleep apnea (adult) (pediatric): Secondary | ICD-10-CM | POA: Diagnosis not present

## 2018-10-28 DIAGNOSIS — C44319 Basal cell carcinoma of skin of other parts of face: Secondary | ICD-10-CM | POA: Diagnosis not present

## 2018-12-01 DIAGNOSIS — G4733 Obstructive sleep apnea (adult) (pediatric): Secondary | ICD-10-CM | POA: Diagnosis not present

## 2018-12-28 DIAGNOSIS — G4733 Obstructive sleep apnea (adult) (pediatric): Secondary | ICD-10-CM | POA: Diagnosis not present

## 2019-11-25 ENCOUNTER — Ambulatory Visit: Payer: Self-pay | Attending: Internal Medicine

## 2019-11-25 DIAGNOSIS — Z23 Encounter for immunization: Secondary | ICD-10-CM | POA: Insufficient documentation

## 2019-11-25 NOTE — Progress Notes (Signed)
   Covid-19 Vaccination Clinic  Name:  [name]    MRN: [id number] DOB: 25-Jun-1971  11/25/2019  [name] was observed post Covid-19 immunization for 15 minutes without incidence. He was provided with Vaccine Information Sheet and instruction to access the V-Safe system.   [name] was instructed to call 911 with any severe reactions post vaccine: Marland Kitchen Difficulty breathing  . Swelling of your face and throat  . A fast heartbeat  . A bad rash all over your body  . Dizziness and weakness    Immunizations Administered    Name Date Dose VIS Date Route   Pfizer COVID-19 Vaccine 11/25/2019 11:58 AM 0.3 mL 09/16/2019 Intramuscular   Manufacturer: [place]   Lot: [id number]   [place]: [id number]

## 2019-12-20 ENCOUNTER — Ambulatory Visit: Payer: Self-pay | Attending: Internal Medicine

## 2019-12-20 DIAGNOSIS — Z23 Encounter for immunization: Secondary | ICD-10-CM

## 2019-12-20 NOTE — Progress Notes (Signed)
   Covid-19 Vaccination Clinic  Name:  Adam Salinas    MRN: [id number] DOB: Aug 26, 1971  12/20/2019  [name] was observed post Covid-19 immunization for 15 minutes without incident. He was provided with Vaccine Information Sheet and instruction to access the V-Safe system.   [name] was instructed to call 911 with any severe reactions post vaccine: Marland Kitchen Difficulty breathing  . Swelling of face and throat  . A fast heartbeat  . A bad rash all over body  . Dizziness and weakness   Immunizations Administered    Name Date Dose VIS Date Route   Pfizer COVID-19 Vaccine 12/20/2019  8:52 AM 0.3 mL 09/16/2019 Intramuscular   Manufacturer: [place]   Lot: [id number]   [place]: [id number]

## 2023-07-28 ENCOUNTER — Other Ambulatory Visit: Payer: Self-pay | Admitting: Family Medicine

## 2023-07-28 DIAGNOSIS — R630 Anorexia: Secondary | ICD-10-CM

## 2023-07-28 DIAGNOSIS — R1013 Epigastric pain: Secondary | ICD-10-CM

## 2023-07-28 DIAGNOSIS — R634 Abnormal weight loss: Secondary | ICD-10-CM

## 2023-08-03 ENCOUNTER — Ambulatory Visit
Admission: RE | Admit: 2023-08-03 | Discharge: 2023-08-03 | Disposition: A | Discharge status: Home / Self Care | Payer: No Typology Code available for payment source | Source: Ambulatory Visit | Attending: Family Medicine | Admitting: Family Medicine

## 2023-08-03 DIAGNOSIS — R1013 Epigastric pain: Secondary | ICD-10-CM

## 2023-08-03 DIAGNOSIS — R634 Abnormal weight loss: Secondary | ICD-10-CM

## 2023-08-03 DIAGNOSIS — R630 Anorexia: Secondary | ICD-10-CM

## 2024-11-02 ENCOUNTER — Other Ambulatory Visit (HOSPITAL_BASED_OUTPATIENT_CLINIC_OR_DEPARTMENT_OTHER): Payer: Self-pay | Admitting: Family Medicine

## 2024-11-02 DIAGNOSIS — Z8249 Family history of ischemic heart disease and other diseases of the circulatory system: Secondary | ICD-10-CM

## 2024-11-28 ENCOUNTER — Other Ambulatory Visit (HOSPITAL_BASED_OUTPATIENT_CLINIC_OR_DEPARTMENT_OTHER)
